# Patient Record
Sex: Female | Born: 1965
Health system: Southern US, Community
[De-identification: ages and names within clinical notes are randomized; demographics above are authoritative.]

## PROBLEM LIST (undated history)

## (undated) HISTORY — PX: TONSILLECTOMY: SUR1361

---

## 2017-01-18 ENCOUNTER — Ambulatory Visit (INDEPENDENT_AMBULATORY_CARE_PROVIDER_SITE_OTHER): Payer: BLUE CROSS/BLUE SHIELD

## 2017-01-18 ENCOUNTER — Ambulatory Visit (INDEPENDENT_AMBULATORY_CARE_PROVIDER_SITE_OTHER): Payer: BLUE CROSS/BLUE SHIELD | Admitting: Podiatry

## 2017-01-18 ENCOUNTER — Encounter: Payer: Self-pay | Admitting: Podiatry

## 2017-01-18 VITALS — BP 96/56 | HR 76 | Ht 65.0 in | Wt 155.0 lb

## 2017-01-18 DIAGNOSIS — M722 Plantar fascial fibromatosis: Secondary | ICD-10-CM | POA: Diagnosis not present

## 2017-01-18 DIAGNOSIS — M216X9 Other acquired deformities of unspecified foot: Secondary | ICD-10-CM

## 2017-01-18 NOTE — Progress Notes (Signed)
   Subjective:    Patient ID: Destiny Barber, female    DOB: 05/21/1955, 51 y.o.   MRN: [id number] Chief Complaint  Patient presents with  . Foot Pain    Bil pain in heels and arches    HPI 51 y.o. female presents with the above complaint.  Reports bilateral heel pain, right equal to left.  Has had this pain for several weeks.  Denies known injury.  States that she is normally very active but cannot run due to the pain.  History reviewed. No pertinent past medical history. History reviewed. No pertinent surgical history.  Current Outpatient Medications:  .  acetaminophen (TYLENOL) 500 MG tablet, Take 500 mg every 6 (six) hours as needed by mouth., Disp: , Rfl:  .  ibuprofen (ADVIL,MOTRIN) 200 MG tablet, Take 200 mg every 6 (six) hours as needed by mouth., Disp: , Rfl:  .  Multiple Vitamin (MULTIVITAMIN WITH MINERALS) TABS tablet, Take 1 tablet daily by mouth., Disp: , Rfl:   Allergies  Allergen Reactions  . Penicillins Anaphylaxis  . Latex Itching and Rash      Review of Systems  Constitutional: Negative.   HENT: Positive for sinus pressure.   Eyes: Negative.   Respiratory: Negative.   Cardiovascular: Negative.   Gastrointestinal: Negative.   Endocrine: Negative.   Genitourinary: Negative.   Musculoskeletal: Positive for back pain.  Skin: Negative.   Allergic/Immunologic: Negative.   Neurological: Negative.   Hematological: Negative.   Psychiatric/Behavioral: Negative.   All other systems reviewed and are negative.      Objective:   Physical Exam Vitals:   01/18/17 0854  BP: (!) 96/56  Pulse: 76   General AA&O x3. Normal mood and affect.  Vascular Dorsalis pedis and posterior tibial pulses  present 2+ bilaterally  Capillary refill normal to all digits. Pedal hair growth normal.  Neurologic Epicritic sensation grossly present bilaterally.  Dermatologic No open lesions. Interspaces clear of maceration. Nails well groomed and normal in appearance.  Orthopedic: MMT  5/5 in dorsiflexion, plantarflexion, inversion, and eversion bilaterally. Tender to palpation at the calcaneal tuber bilaterally. No pain with calcaneal squeeze bilaterally. Ankle ROM diminished range of motion bilaterally. Silfverskiold Test: positive bilaterally.   Radiographs: Taken and reviewed. No acute fractures. No evidence of calcaneal stress fracture.    Assessment & Plan:  Patient was evaluated and treated and all questions answered  Plantar Fasciitis, bilaterally - XR reviewed as above.  - Educated on icing and stretching. Instructions given.  - Injection consisting of 1cc 0.5 % Marcaine plain, delivered to the plantar fascia. - Plantar fascial rest strap  - Night splint dispensed.   Procedure: Injection Tendon/Ligament Location: Bilateral plantar fascia at the glabrous junction; medial approach. Skin Prep: Alcohol. Injectate: 1 cc 0.5% marcaine plain, 1 cc dexamethasone phosphate, 0.5 cc kenalog 10. Disposition: Patient tolerated procedure well. Injection site dressed with a band-aid.

## 2017-01-18 NOTE — Patient Instructions (Signed)

## 2017-02-08 ENCOUNTER — Ambulatory Visit: Payer: BLUE CROSS/BLUE SHIELD | Admitting: Podiatry

## 2017-02-15 ENCOUNTER — Ambulatory Visit (INDEPENDENT_AMBULATORY_CARE_PROVIDER_SITE_OTHER): Payer: BLUE CROSS/BLUE SHIELD | Admitting: Podiatry

## 2017-02-15 ENCOUNTER — Encounter: Payer: Self-pay | Admitting: Podiatry

## 2017-02-15 DIAGNOSIS — M722 Plantar fascial fibromatosis: Secondary | ICD-10-CM

## 2017-02-15 DIAGNOSIS — M216X9 Other acquired deformities of unspecified foot: Secondary | ICD-10-CM | POA: Diagnosis not present

## 2017-02-15 MED ORDER — MELOXICAM 15 MG PO TABS: 15.0000 mg | ORAL_TABLET | Freq: Every day | ORAL | 0 refills | Status: AC

## 2017-02-15 NOTE — Progress Notes (Signed)
  Subjective:  Patient ID: Destiny Barber, female    DOB: 02/22/1966,  MRN: [id number]  51 y.o. female returns for follow-up plantar fasciitis.  Reports her pain is doing much better.  Still having some pain worse in the morning.  Objective:  There were no vitals filed for this visit. General AA&O x3. Normal mood and affect.  Vascular Pedal pulses palpable.  Neurologic Epicritic sensation grossly intact.  Dermatologic No open lesions. Skin normal texture and turgor.  Orthopedic: Pain to palpation bilateral plantar fascial medial calcaneal tuber    Assessment & Plan:  Patient was evaluated and treated and all questions answered.  Plantar fasciitis -Improving.  Declined repeat injection today -Rx meloxicam -Continue stretching exercises  Return in about 4 weeks (around 03/15/2017) for Plantar fasciitis.

## 2017-03-15 ENCOUNTER — Ambulatory Visit: Payer: BLUE CROSS/BLUE SHIELD | Admitting: Podiatry

## 2018-09-26 ENCOUNTER — Emergency Department (HOSPITAL_BASED_OUTPATIENT_CLINIC_OR_DEPARTMENT_OTHER)
Admission: EM | Admit: 2018-09-26 | Discharge: 2018-09-26 | Disposition: A | Discharge status: Home / Self Care | Payer: No Typology Code available for payment source | Attending: Emergency Medicine | Admitting: Emergency Medicine

## 2018-09-26 ENCOUNTER — Encounter (HOSPITAL_BASED_OUTPATIENT_CLINIC_OR_DEPARTMENT_OTHER): Payer: Self-pay | Admitting: *Deleted

## 2018-09-26 ENCOUNTER — Other Ambulatory Visit: Payer: Self-pay

## 2018-09-26 ENCOUNTER — Emergency Department (HOSPITAL_BASED_OUTPATIENT_CLINIC_OR_DEPARTMENT_OTHER): Payer: No Typology Code available for payment source

## 2018-09-26 DIAGNOSIS — Z79899 Other long term (current) drug therapy: Secondary | ICD-10-CM | POA: Diagnosis not present

## 2018-09-26 DIAGNOSIS — M25561 Pain in right knee: Secondary | ICD-10-CM | POA: Diagnosis not present

## 2018-09-26 DIAGNOSIS — W010XXA Fall on same level from slipping, tripping and stumbling without subsequent striking against object, initial encounter: Secondary | ICD-10-CM | POA: Insufficient documentation

## 2018-09-26 DIAGNOSIS — F17210 Nicotine dependence, cigarettes, uncomplicated: Secondary | ICD-10-CM | POA: Insufficient documentation

## 2018-09-26 DIAGNOSIS — Y990 Civilian activity done for income or pay: Secondary | ICD-10-CM | POA: Diagnosis not present

## 2018-09-26 DIAGNOSIS — M25562 Pain in left knee: Secondary | ICD-10-CM

## 2018-09-26 MED ORDER — NAPROXEN 250 MG PO TABS
500.0000 mg | ORAL_TABLET | Freq: Once | ORAL | Status: AC
  Administered 2018-09-26: 500 mg via ORAL
  Filled 2018-09-26: qty 2

## 2018-09-26 MED ORDER — NAPROXEN 500 MG PO TABS: 500.0000 mg | ORAL_TABLET | Freq: Two times a day (BID) | ORAL | 0 refills | Status: AC

## 2018-09-26 MED FILL — NAPROXEN 500 MG TABLET: 500 | 5 days supply | Qty: 10 | Fill #0

## 2018-09-26 NOTE — ED Triage Notes (Signed)
Left knee pain. She slipped and fell on water at work this am. [employer].

## 2018-09-26 NOTE — ED Notes (Signed)
Patient transported to X-ray 

## 2018-09-26 NOTE — Discharge Instructions (Signed)
Please read and follow all provided instructions.  You have been seen today for left knee pain after a fall.   Tests performed today include: An x-ray of the affected area - does NOT show any broken bones or dislocations.  Vital signs. See below for your results today.   Home care instructions: -- *PRICE in the first 24-48 hours after injury: Protect (with brace, splint, sling), if given by your provider Rest Ice- Do not apply ice pack directly to your skin, place towel or similar between your skin and ice/ice pack. Apply ice for 20 min, then remove for 40 min while awake Compression- Wear brace, elastic bandage, splint as directed by your provider Elevate affected extremity above the level of your heart when not walking around for the first 24-48 hours   Medications:  - Naproxen is a nonsteroidal anti-inflammatory medication that will help with pain and swelling. Be sure to take this medication as prescribed with food, 1 pill every 12 hours,  It should be taken with food, as it can cause stomach upset, and more seriously, stomach bleeding. Do not take other nonsteroidal anti-inflammatory medications with this such as Advil, Motrin, Aleve, Mobic, Goodie Powder, or Motrin.    You make take Tylenol per over the counter dosing with these medications.   We have prescribed you new medication(s) today. Discuss the medications prescribed today with your pharmacist as they can have adverse effects and interactions with your other medicines including over the counter and prescribed medications. Seek medical evaluation if you start to experience new or abnormal symptoms after taking one of these medicines, seek care immediately if you start to experience difficulty breathing, feeling of your throat closing, facial swelling, or rash as these could be indications of a more serious allergic reaction   Follow-up instructions: Please follow-up with your primary care provider or the provided orthopedic  physician (bone specialist) if you continue to have significant pain in 1 week. In this case you may have a more severe injury that requires further care.   Return instructions:  Please return if your digits or extremity are numb or tingling, appear gray or blue, or you have severe pain (also elevate the extremity and loosen splint or wrap if you were given one) Please return if you have redness or fevers.  Please return to the Emergency Department if you experience worsening symptoms.  Please return if you have any other emergent concerns. Additional Information:  Your vital signs today were: BP 101/78 (BP Location: Right Arm)    Pulse 82    Temp 98.2 F (36.8 C) (Oral)    Resp 14    Ht 5\' 6"  (1.676 m)    Wt 77.1 kg    SpO2 96%    BMI 27.44 kg/m  If your blood pressure (BP) was elevated above 135/85 this visit, please have this repeated by your doctor within one month. ---------------

## 2018-09-26 NOTE — ED Provider Notes (Signed)
[facility] EMERGENCY DEPARTMENT Provider Note   CSN: [id number] Arrival date & time: 09/26/18  1244     History   Chief Complaint Chief Complaint  Patient presents with   Knee Pain    HPI Destiny Barber is a [age] y.o. female with a hx of tobacco abuse who presents to the ED with complaints of L knee pain s/p injury shortly PTA.  Patient states that she was ambulating on a wet floor at work leading her to slip and fall forward onto her left knee.  She denies head injury or loss of consciousness.  No prodromal lightheadedness, dizziness, chest pain, or shortness of breath.  She states she fell onto the anterior left knee, has been having pain since, current discomfort is a 7 out of 10 in severity, worse with movement, no alleviating factors, no intervention prior to arrival.  She has weight-bear on the left lower extremity since the fall. Denies numbness, tingling, or weakness. Last tetanus within past 5 years.      HPI  History reviewed. No pertinent past medical history.  There are no active problems to display for this patient.   Past Surgical History:  Procedure Laterality Date   CESAREAN SECTION     TONSILLECTOMY       OB History   No obstetric history on file.      Home Medications    Prior to Admission medications   Medication Sig Start Date End Date Taking? Authorizing Provider  acetaminophen (TYLENOL) 500 MG tablet Take 500 mg every 6 (six) hours as needed by mouth.    [provider]  ibuprofen (ADVIL,MOTRIN) 200 MG tablet Take 200 mg every 6 (six) hours as needed by mouth.    [provider]  meloxicam (MOBIC) 15 MG tablet Take 1 tablet (15 mg total) by mouth daily. 02/15/17   Park LiterPrice, Michael J, DPM  Multiple Vitamin (MULTIVITAMIN WITH MINERALS) TABS tablet Take 1 tablet daily by mouth.    [provider]    Family History No family history on file.  Social History Social History   Tobacco Use   Smoking status:  Current Every Day Smoker    Packs/day: 1.00    Types: Cigarettes   Smokeless tobacco: Never Used  Substance Use Topics   Alcohol use: No    Frequency: Never   Drug use: No     Allergies   Penicillins and Latex   Review of Systems Review of Systems  Constitutional: Negative for chills and fever.  Respiratory: Negative for shortness of breath.   Cardiovascular: Negative for chest pain.  Musculoskeletal: Positive for arthralgias. Negative for back pain and neck pain.  Skin: Positive for wound.  Neurological: Negative for dizziness, weakness, light-headedness, numbness and headaches.     Physical Exam Updated Vital Signs BP 101/78 (BP Location: Right Arm)    Pulse 82    Temp 98.2 F (36.8 C) (Oral)    Resp 14    Ht 5\' 6"  (1.676 m)    Wt 77.1 kg    SpO2 96%    BMI 27.44 kg/m    Physical Exam Vitals signs and nursing note reviewed.  Constitutional:      General: She is not in acute distress.    Appearance: She is not ill-appearing or toxic-appearing.  HENT:     Head: Normocephalic and atraumatic.     Comments: No raccoon eyes or battle sign. Neck:     Musculoskeletal: Normal range of motion.  Comments: No midline cervical tenderness. Cardiovascular:     Pulses:          Dorsalis pedis pulses are 2+ on the right side and 2+ on the left side.       Posterior tibial pulses are 2+ on the right side and 2+ on the left side.  Pulmonary:     Effort: Pulmonary effort is normal.  Chest:     Chest wall: No tenderness.  Abdominal:     Tenderness: There is no abdominal tenderness.  Musculoskeletal:     Comments: Lower extremities: There is a small superficial abrasion noted to the left anterior knee.  No obvious deformity, appreciable swelling, edema, erythema, ecchymosis, or warmth.. Patient has intact AROM to bilateral hips, knees, ankles, and all digits with exception of mild limitation of left knee flexion/extension, able to move somewhat knee to these directions, able  to flex 90 degrees, patient reports this is secondary to pain.  Tender to palpation to the left anterior knee including the patella extends to the proximal one third of the tibia.  She is also tender over the left anterior lateral hip, no point/focal tenderness about the hip.  Neurovascularly intact distally. Back: No midline tenderness.  Skin:    General: Skin is warm and dry.     Capillary Refill: Capillary refill takes less than 2 seconds.  Neurological:     General: No focal deficit present.     Mental Status: She is alert.     Comments: Alert. Clear speech. Sensation grossly intact to bilateral lower extremities. 5/5 strength with knee flexion/extension & ankle plantar/dorsiflexion bilaterally  Psychiatric:        Mood and Affect: Mood normal.        Behavior: Behavior normal.    ED Treatments / Results  Labs (all labs ordered are listed, but only abnormal results are displayed) Labs Reviewed - No data to display  EKG None  Radiology Dg Knee Complete 4 Views Left  Result Date: 09/26/2018 CLINICAL DATA:  Fall. EXAM: LEFT KNEE - COMPLETE 4+ VIEW COMPARISON:  None. FINDINGS: No evidence of fracture, dislocation, or joint effusion. No evidence of arthropathy or other focal bone abnormality. Soft tissues are unremarkable. IMPRESSION: Negative. Electronically Signed   By: Titus Dubin M.D.   On: 09/26/2018 13:13    Procedures Procedures (including critical care time)  SPLINT APPLICATION Date/Time: 7:62 PM Authorized by: Kennith Maes Consent: Verbal consent obtained. Risks and benefits: risks, benefits and alternatives were discussed Consent given by: patient Splint applied by: NT/RN staff Location details: LLE Splint type: knee immobilizer Supplies used: knee immobilizer Post-procedure: The splinted body part was neurovascularly unchanged following the procedure. Patient tolerance: Patient tolerated the procedure well with no immediate complications.  Medications  Ordered in ED Medications  naproxen (NAPROSYN) tablet 500 mg (has no administration in time range)     Initial Impression / Assessment and Plan / ED Course  I have reviewed the triage vital signs and the nursing notes.  Pertinent labs & imaging results that were available during my care of the patient were reviewed by me and considered in my medical decision making (see chart for details).   Patient presents to the emergency department status post mechanical fall with left knee pain.  Nontoxic-appearing, no apparent distress, vitals WNL.  No signs of serious head, neck, back, or intrathoracic/abdominal injury.  No midline spinal tenderness, chest tenderness, abdominal tenderness, or focal neurologic deficits.  Abrasion noted to left anterior knee, tetanus is up-to-date,  no intervention needed with sutures, staples, or skin adhesive.  She has fairly intact active range of motion, mild limitation about the knee, she is tender over the left knee primarily but also the left hip somewhat.  X-ray of the left knee per triage negative for fracture or dislocation.  I recommended x-ray of her left hip which she refused-low suspicion for fracture dislocation given patient has bared weight since the fall.  She is neurovascularly intact distally, good strength with knee flexion/extnesion.  Will provide naproxen & knee immobilizer with recommendation for PRICE and sports medicine follow-up. I discussed results, treatment plan, need for follow-up, and return precautions with the patient. Provided opportunity for questions, patient confirmed understanding and is in agreement with plan.   Final Clinical Impressions(s) / ED Diagnoses   Final diagnoses:  Acute pain of left knee    ED Discharge Orders         Ordered    naproxen (NAPROSYN) 500 MG tablet  2 times daily     09/26/18 1334           Dezaree Tracey, NewlandSamantha R, PA-C 09/26/18 1335    Sabas SousBero, Michael M, MD 09/27/18 [phone]

## 2018-09-28 ENCOUNTER — Other Ambulatory Visit: Payer: Self-pay

## 2018-09-28 ENCOUNTER — Ambulatory Visit (INDEPENDENT_AMBULATORY_CARE_PROVIDER_SITE_OTHER): Payer: No Typology Code available for payment source | Admitting: Family Medicine

## 2018-09-28 ENCOUNTER — Encounter: Payer: Self-pay | Admitting: Family Medicine

## 2018-09-28 ENCOUNTER — Ambulatory Visit: Payer: Self-pay

## 2018-09-28 VITALS — BP 113/74 | HR 100 | Ht 66.0 in | Wt 170.0 lb

## 2018-09-28 DIAGNOSIS — S8992XA Unspecified injury of left lower leg, initial encounter: Secondary | ICD-10-CM | POA: Insufficient documentation

## 2018-09-28 NOTE — Progress Notes (Signed)
Destiny Barber - 53 y.o. female MRN [id number]  Date of birth: [birth year]  SUBJECTIVE:  Including CC & ROS.  Chief Complaint  Patient presents with  . Knee Injury    left knee x 09/26/2018    Destiny Barber is a 53 y.o. female that is presenting with left knee pain.  She had an injury that occurred while at work.  She slipped and landed on the patella of the left knee.  This occurred on 7/13.  Since that time she has had significant pain.  She was evaluated in the emergency department.  She was placed in a knee immobilizer and provided crutches.  She has been taking anti-inflammatories with improvement of her pain.  The pain seems to be anterior nature.  Denies any radiation.  Pain can be sharp and stabbing.  Can be moderate to severe.  Worse with ambulation.  Independent review of the left knee x-ray from 7/13 shows no acute abnormality.   Review of Systems  Constitutional: Negative for fever.  HENT: Negative for congestion.   Respiratory: Negative for cough.   Cardiovascular: Negative for chest pain.  Gastrointestinal: Negative for abdominal pain.  Musculoskeletal: Positive for gait problem.  Skin: Negative for color change.  Neurological: Negative for weakness.  Hematological: Negative for adenopathy.    HISTORY: Past Medical, Surgical, Social, and Family History Reviewed & Updated per EMR.   Pertinent Historical Findings include:  No past medical history on file.  Past Surgical History:  Procedure Laterality Date  . CESAREAN SECTION    . TONSILLECTOMY      Allergies  Allergen Reactions  . Penicillins Anaphylaxis  . Latex Itching and Rash    No family history on file.   Social History   Socioeconomic History  . Marital status: Married    Spouse name: Not on file  . Number of children: Not on file  . Years of education: Not on file  . Highest education level: Not on file  Occupational History  . Not on file  Social Needs  . Financial resource strain: Not on file   . Food insecurity    Worry: Not on file    Inability: Not on file  . Transportation needs    Medical: Not on file    Non-medical: Not on file  Tobacco Use  . Smoking status: Current Every Day Smoker    Packs/day: 1.00    Types: Cigarettes  . Smokeless tobacco: Never Used  Substance and Sexual Activity  . Alcohol use: No    Frequency: Never  . Drug use: No  . Sexual activity: Yes    Partners: Male    Birth control/protection: None  Lifestyle  . Physical activity    Days per week: Not on file    Minutes per session: Not on file  . Stress: Not on file  Relationships  . Social Herbalist on phone: Not on file    Gets together: Not on file    Attends religious service: Not on file    Active member of club or organization: Not on file    Attends meetings of clubs or organizations: Not on file    Relationship status: Not on file  . Intimate partner violence    Fear of current or ex partner: Not on file    Emotionally abused: Not on file    Physically abused: Not on file    Forced sexual activity: Not on file  Other Topics Concern  . Not  on file  Social History Narrative  . Not on file     PHYSICAL EXAM:  VS: BP 113/74   Pulse 100   Ht 5\' 6"  (1.676 m)   Wt 170 lb (77.1 kg)   BMI 27.44 kg/m  Physical Exam Gen: NAD, alert, cooperative with exam, well-appearing ENT: normal lips, normal nasal mucosa,  Eye: normal EOM, normal conjunctiva and lids CV:  no edema, +2 pedal pulses   Resp: no accessory muscle use, non-labored,  Skin: no rashes, no areas of induration  Neuro: normal tone, normal sensation to touch Psych:  normal insight, alert and oriented MSK:  Left knee: No obvious effusion. Tenderness to palpation over the patella and origin of the patellar tendon. Pain with flexion and extension. Tenderness palpation over the medial lateral joint line. Limited exam secondary to pain. Neurovascular intact  Limited ultrasound: Left knee:  No effusion  within the suprapatellar pouch. Normal appearing quadricep and patellar tendon. No prepatellar bursitis. Chronic degenerative changes of the medial joint line.  No significant outpouching or change of the medial meniscus. No significant changes of the lateral meniscus and joint line.  Summary: No acute findings  Ultrasound and interpretation by Clare GandyJeremy Kolby Schara, MD      ASSESSMENT & PLAN:   Knee injury, left, initial encounter No structural changes appreciated on ultrasound or x-ray today.  May have a bony contusion.  Landed on the kneecap but does not have any prepatellar bursitis. -Placed in a hinged knee brace. -Can continue the crutches as the pain continues. -Counseled on home exercise therapy and supportive care. -Provided a work note. -Can follow-up in 2 weeks

## 2018-09-28 NOTE — Patient Instructions (Signed)
Nice to meet you Please try ice  Please use tylenol and naproxen  Please try to elevate your knee when you can.   Please send me a message in MyChart with any questions or updates.  Please see me back in 2 weeks.   --Dr. Raeford Razor

## 2018-09-28 NOTE — Assessment & Plan Note (Signed)
No structural changes appreciated on ultrasound or x-ray today.  May have a bony contusion.  Landed on the kneecap but does not have any prepatellar bursitis. -Placed in a hinged knee brace. -Can continue the crutches as the pain continues. -Counseled on home exercise therapy and supportive care. -Provided a work note. -Can follow-up in 2 weeks

## 2018-10-11 NOTE — Progress Notes (Signed)
Destiny Barber Biddy - 53 y.o. female MRN [id number]  Date of birth: 08/25/1965  SUBJECTIVE:  Including CC & ROS.  No chief complaint on file.   Destiny Barber Grupe is a 53 y.o. female that is following up for her left knee pain.  She had a fall while at work and landed on her patella.  She was given restrictions and given a hinged knee brace.  Since that time her symptoms have significantly improved.  Initially occurred roughly 2 weeks ago.  Denies any color changes.  No weakness or instability.  Has minor soreness as she puts the knee on the ground but otherwise is doing well.    Review of Systems  Constitutional: Negative for fever.  HENT: Negative for congestion.   Respiratory: Negative for cough.   Cardiovascular: Negative for chest pain.  Gastrointestinal: Negative for abdominal pain.  Musculoskeletal: Negative for gait problem.  Skin: Negative for color change.  Neurological: Negative for weakness.  Hematological: Negative for adenopathy.    HISTORY: Past Medical, Surgical, Social, and Family History Reviewed & Updated per EMR.   Pertinent Historical Findings include:  No past medical history on file.  Past Surgical History:  Procedure Laterality Date  . CESAREAN SECTION    . TONSILLECTOMY      Allergies  Allergen Reactions  . Penicillins Anaphylaxis  . Latex Itching and Rash    No family history on file.   Social History   Socioeconomic History  . Marital status: Married    Spouse name: Not on file  . Number of children: Not on file  . Years of education: Not on file  . Highest education level: Not on file  Occupational History  . Not on file  Social Needs  . Financial resource strain: Not on file  . Food insecurity    Worry: Not on file    Inability: Not on file  . Transportation needs    Medical: Not on file    Non-medical: Not on file  Tobacco Use  . Smoking status: Current Every Day Smoker    Packs/day: 1.00    Types: Cigarettes  . Smokeless tobacco: Never  Used  Substance and Sexual Activity  . Alcohol use: No    Frequency: Never  . Drug use: No  . Sexual activity: Yes    Partners: Male    Birth control/protection: None  Lifestyle  . Physical activity    Days per week: Not on file    Minutes per session: Not on file  . Stress: Not on file  Relationships  . Social Musicianconnections    Talks on phone: Not on file    Gets together: Not on file    Attends religious service: Not on file    Active member of club or organization: Not on file    Attends meetings of clubs or organizations: Not on file    Relationship status: Not on file  . Intimate partner violence    Fear of current or ex partner: Not on file    Emotionally abused: Not on file    Physically abused: Not on file    Forced sexual activity: Not on file  Other Topics Concern  . Not on file  Social History Narrative  . Not on file     PHYSICAL EXAM:  VS: BP 107/63   Pulse 93   Ht 5\' 6"  (1.676 m)   Wt 170 lb (77.1 kg)   BMI 27.44 kg/m  Physical Exam Gen: NAD, alert, cooperative with  exam, well-appearing ENT: normal lips, normal nasal mucosa,  Eye: normal EOM, normal conjunctiva and lids CV:  no edema, +2 pedal pulses   Resp: no accessory muscle use, non-labored,  Skin: no rashes, no areas of induration  Neuro: normal tone, normal sensation to touch Psych:  normal insight, alert and oriented MSK:  Left knee:  No effusion  Normal ROM  Normal strength to resistance  No instability  Negative McMurray's test. Neurovascular intact     ASSESSMENT & PLAN:   Knee injury, left, initial encounter Significant improvement of her symptoms upon presentation today. -Counseled on home exercise therapy and supportive care. -Provided work note with no restrictions. -Follow-up as needed.

## 2018-10-12 ENCOUNTER — Encounter: Payer: Self-pay | Admitting: Family Medicine

## 2018-10-12 ENCOUNTER — Other Ambulatory Visit: Payer: Self-pay

## 2018-10-12 ENCOUNTER — Ambulatory Visit (INDEPENDENT_AMBULATORY_CARE_PROVIDER_SITE_OTHER): Payer: No Typology Code available for payment source | Admitting: Family Medicine

## 2018-10-12 DIAGNOSIS — S8992XA Unspecified injury of left lower leg, initial encounter: Secondary | ICD-10-CM

## 2018-10-12 NOTE — Patient Instructions (Signed)
Good to see you Glad to hear that your knee is doing better  Please use ice if needed  Please continue the exercises   Please send me a message in MyChart with any questions or updates.  Please see me back in 2-3 months to monitor or as needed.   --Dr. Raeford Razor

## 2018-10-12 NOTE — Assessment & Plan Note (Signed)
Significant improvement of her symptoms upon presentation today. -Counseled on home exercise therapy and supportive care. -Provided work note with no restrictions. -Follow-up as needed.

## 2020-02-27 IMAGING — CR LEFT KNEE - COMPLETE 4+ VIEW
4 series · 4 of 4 positions shown · non-contrast
Comparison: None.

CLINICAL DATA: Fall.

EXAM:
LEFT KNEE - COMPLETE 4+ VIEW

[t knee ap left]
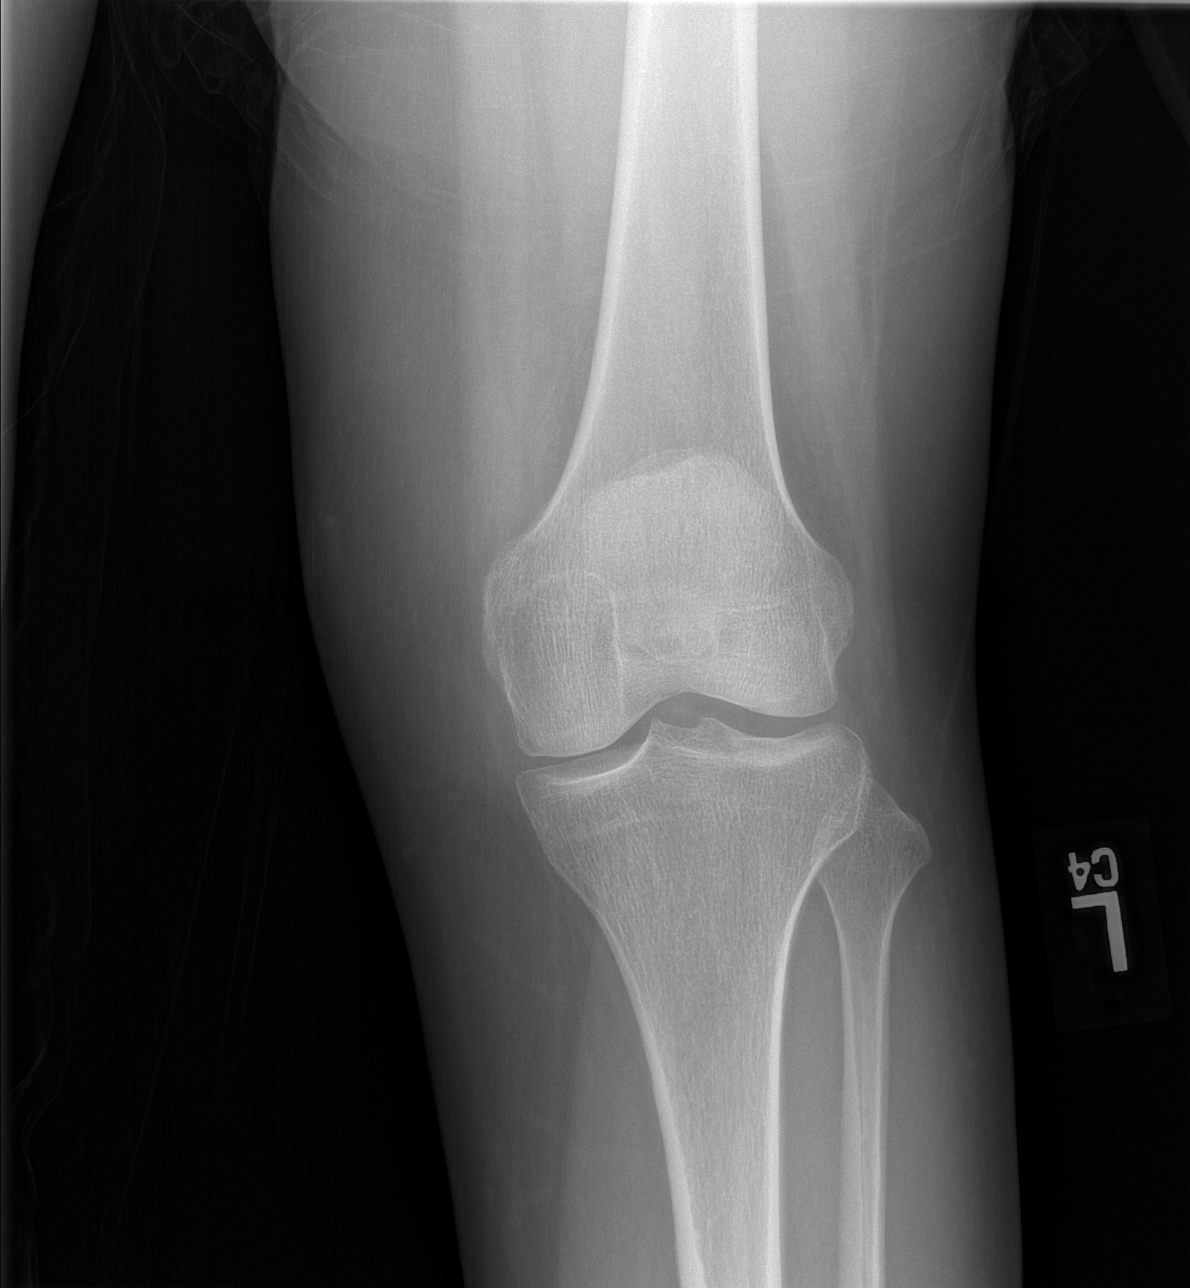

[t knee oblique left (1 of 2)]
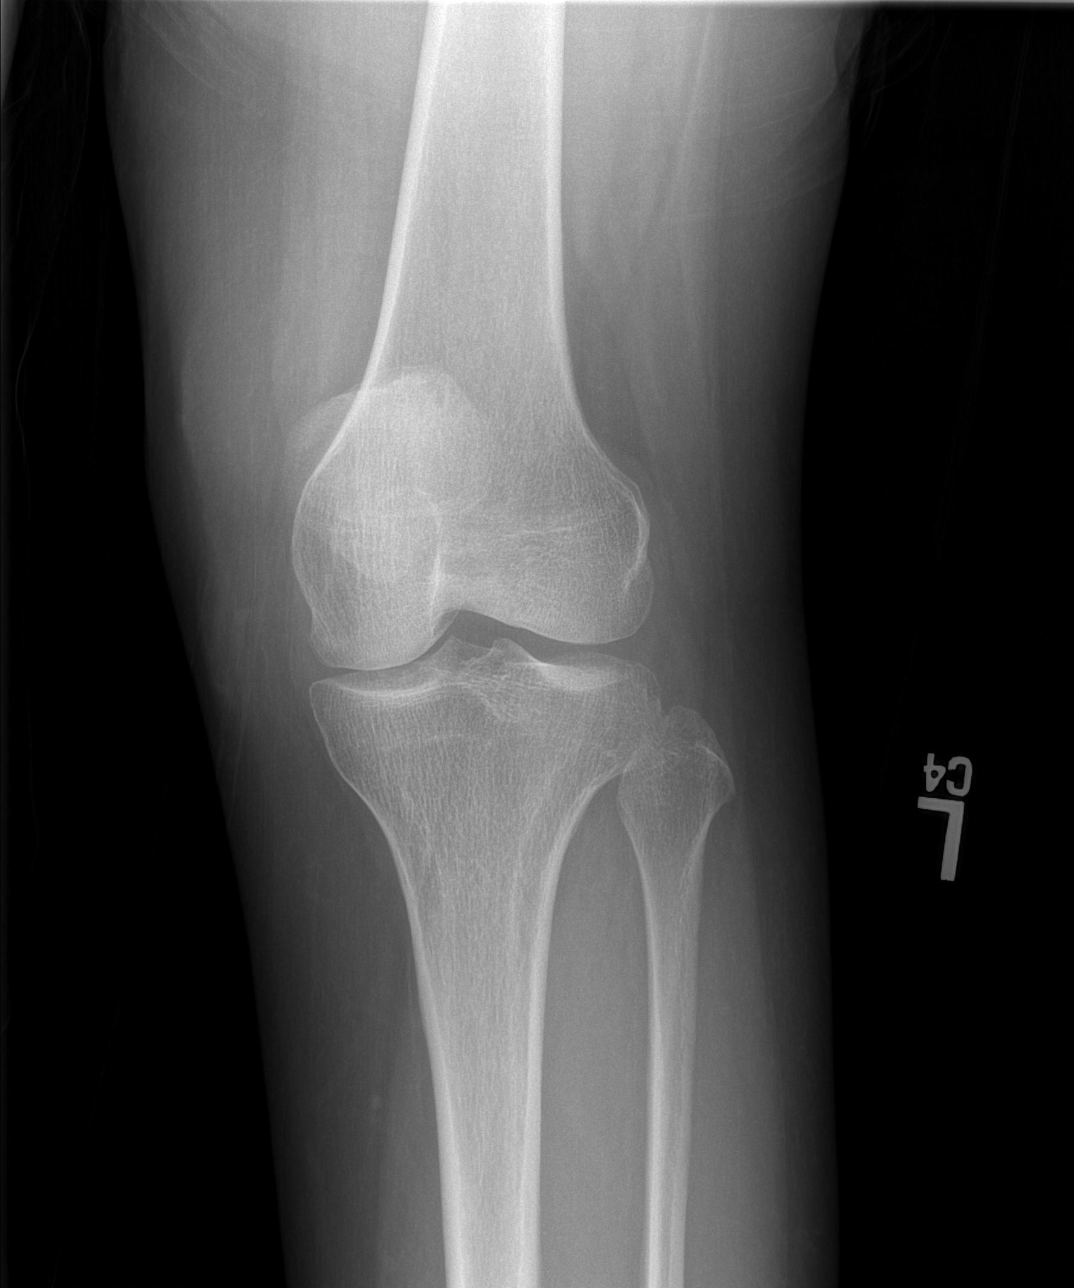

[t knee oblique left (2 of 2)]
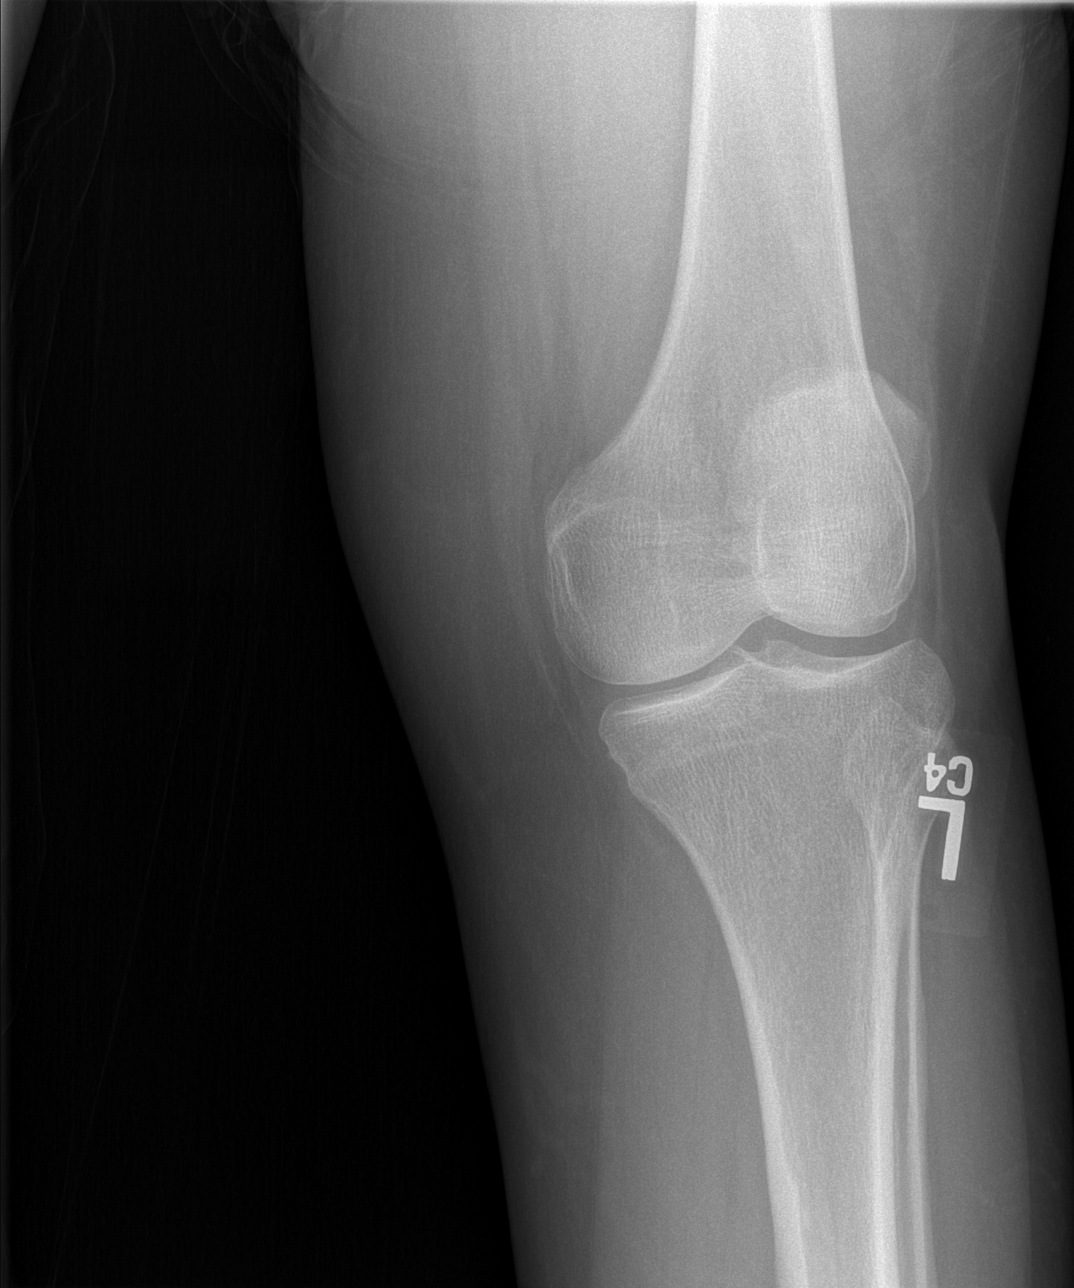

[t knee lat left]
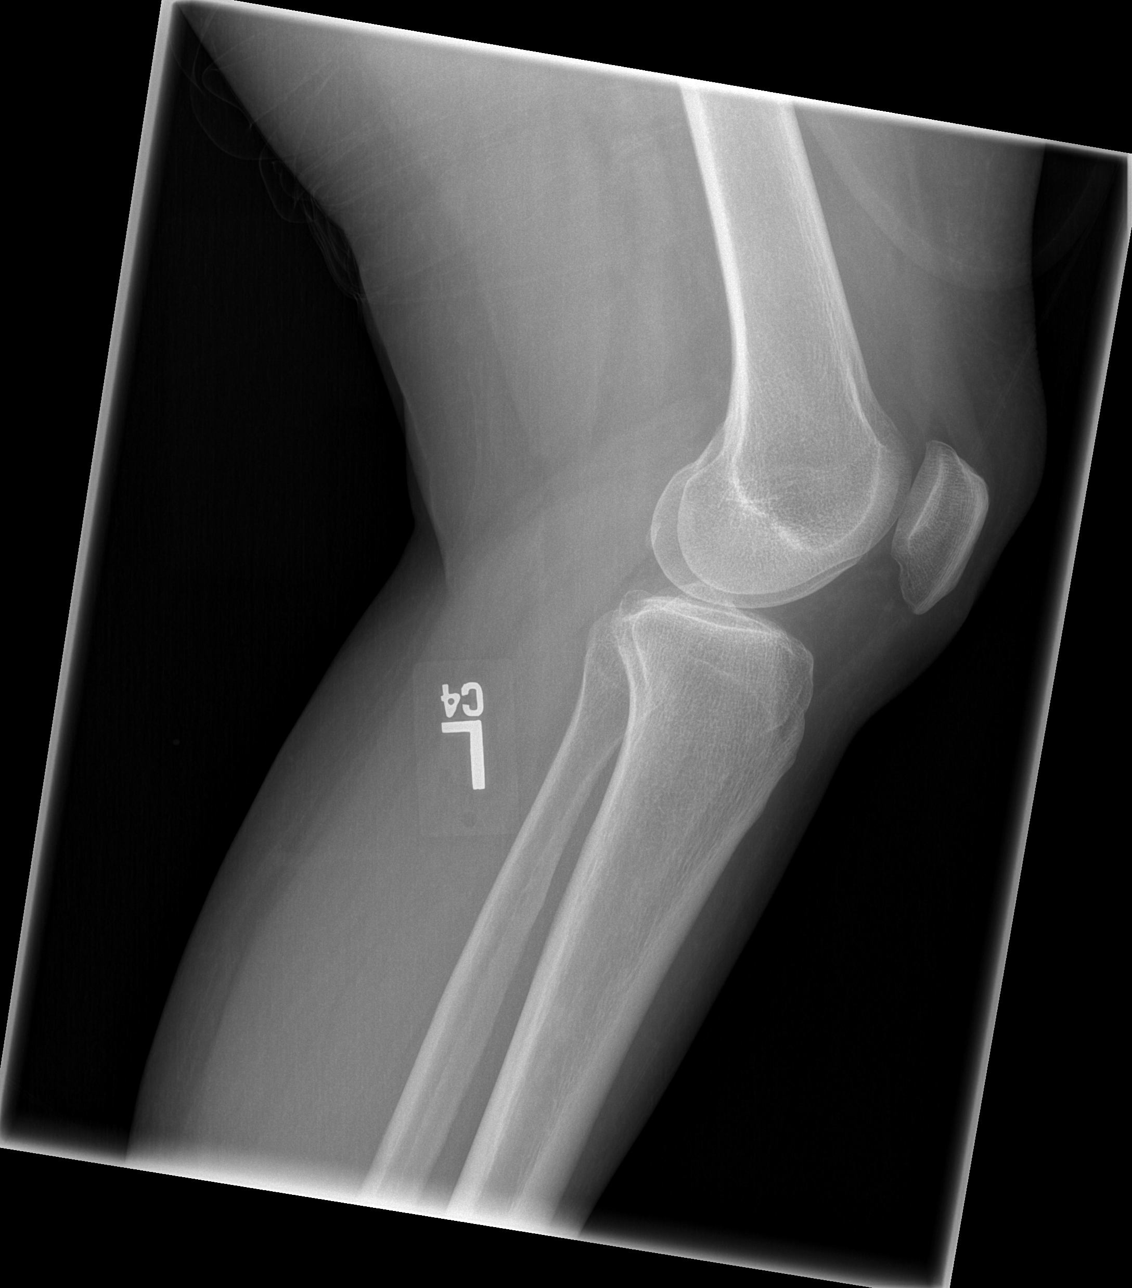

[4 of 4 positions shown; findings below may reference images not displayed]

FINDINGS: No evidence of fracture, dislocation, or joint effusion. No evidence
of arthropathy or other focal bone abnormality. Soft tissues are
unremarkable.
IMPRESSION: Negative.

## 2022-07-02 ENCOUNTER — Encounter: Payer: Self-pay | Admitting: *Deleted
